# Patient Record
Sex: Female | Born: 1950
Health system: Southern US, Community
[De-identification: ages and names within clinical notes are randomized; demographics above are authoritative.]

## PROBLEM LIST (undated history)

## (undated) DIAGNOSIS — E049 Nontoxic goiter, unspecified: Secondary | ICD-10-CM

## (undated) DIAGNOSIS — I10 Essential (primary) hypertension: Secondary | ICD-10-CM

## (undated) DIAGNOSIS — I4891 Unspecified atrial fibrillation: Secondary | ICD-10-CM

## (undated) DIAGNOSIS — E079 Disorder of thyroid, unspecified: Secondary | ICD-10-CM

## (undated) DIAGNOSIS — I499 Cardiac arrhythmia, unspecified: Secondary | ICD-10-CM

## (undated) HISTORY — PX: ABDOMINAL HYSTERECTOMY: SHX81

## (undated) HISTORY — DX: Essential (primary) hypertension: I10

## (undated) HISTORY — DX: Cardiac arrhythmia, unspecified: I49.9

---

## 2003-05-18 ENCOUNTER — Emergency Department (HOSPITAL_COMMUNITY): Admission: EM | Admit: 2003-05-18 | Discharge: 2003-05-18 | Payer: Self-pay | Admitting: Emergency Medicine

## 2003-05-18 ENCOUNTER — Encounter: Payer: Self-pay | Admitting: *Deleted

## 2003-06-01 ENCOUNTER — Encounter (HOSPITAL_COMMUNITY): Admission: RE | Admit: 2003-06-01 | Discharge: 2003-08-30 | Payer: Self-pay | Admitting: Family Medicine

## 2017-06-11 ENCOUNTER — Emergency Department (HOSPITAL_BASED_OUTPATIENT_CLINIC_OR_DEPARTMENT_OTHER)
Admission: EM | Admit: 2017-06-11 | Discharge: 2017-06-11 | Disposition: A | Discharge status: Home / Self Care | Payer: Commercial Managed Care - PPO | Attending: Emergency Medicine | Admitting: Emergency Medicine

## 2017-06-11 ENCOUNTER — Encounter (HOSPITAL_BASED_OUTPATIENT_CLINIC_OR_DEPARTMENT_OTHER): Payer: Self-pay | Admitting: *Deleted

## 2017-06-11 ENCOUNTER — Emergency Department (HOSPITAL_BASED_OUTPATIENT_CLINIC_OR_DEPARTMENT_OTHER): Payer: Commercial Managed Care - PPO

## 2017-06-11 DIAGNOSIS — I480 Paroxysmal atrial fibrillation: Secondary | ICD-10-CM | POA: Diagnosis not present

## 2017-06-11 DIAGNOSIS — Z7901 Long term (current) use of anticoagulants: Secondary | ICD-10-CM | POA: Insufficient documentation

## 2017-06-11 DIAGNOSIS — R0602 Shortness of breath: Secondary | ICD-10-CM | POA: Diagnosis present

## 2017-06-11 DIAGNOSIS — I4891 Unspecified atrial fibrillation: Secondary | ICD-10-CM

## 2017-06-11 HISTORY — DX: Nontoxic goiter, unspecified: E04.9

## 2017-06-11 HISTORY — DX: Unspecified atrial fibrillation: I48.91

## 2017-06-11 HISTORY — DX: Disorder of thyroid, unspecified: E07.9

## 2017-06-11 LAB — BASIC METABOLIC PANEL
ANION GAP: 10 (ref 5–15)
BUN: 27 mg/dL — ABNORMAL HIGH (ref 6–20)
CHLORIDE: 103 mmol/L (ref 101–111)
CO2: 24 mmol/L (ref 22–32)
Calcium: 9.2 mg/dL (ref 8.9–10.3)
Creatinine, Ser: 1.49 mg/dL — ABNORMAL HIGH (ref 0.44–1.00)
GFR calc non Af Amer: 35 mL/min — ABNORMAL LOW (ref >60)
GFR, EST AFRICAN AMERICAN: 41 mL/min — AB (ref >60)
Glucose, Bld: 126 mg/dL — ABNORMAL HIGH (ref 65–99)
POTASSIUM: 3.6 mmol/L (ref 3.5–5.1)
SODIUM: 137 mmol/L (ref 135–145)

## 2017-06-11 LAB — CBC
HCT: 40.4 % (ref 36.0–46.0)
HEMOGLOBIN: 13.6 g/dL (ref 12.0–15.0)
MCH: 29.4 pg (ref 26.0–34.0)
MCHC: 33.7 g/dL (ref 30.0–36.0)
MCV: 87.3 fL (ref 78.0–100.0)
PLATELETS: 246 10*3/uL (ref 150–400)
RBC: 4.63 MIL/uL (ref 3.87–5.11)
RDW: 13.6 % (ref 11.5–15.5)
WBC: 8.6 10*3/uL (ref 4.0–10.5)

## 2017-06-11 LAB — TSH: TSH: 0.48 u[IU]/mL (ref 0.350–4.500)

## 2017-06-11 LAB — TROPONIN I: Troponin I: <0.03 ng/mL (ref <0.03)

## 2017-06-11 LAB — MAGNESIUM: Magnesium: 2.1 mg/dL (ref 1.7–2.4)

## 2017-06-11 MED ORDER — DEXTROSE 5 % IV SOLN
5.0000 mg/h | INTRAVENOUS | Status: DC
  Administered 2017-06-11: 5 mg/h via INTRAVENOUS
  Filled 2017-06-11: qty 100

## 2017-06-11 MED ORDER — DILTIAZEM LOAD VIA INFUSION
20.0000 mg | Freq: Once | INTRAVENOUS | Status: AC
  Administered 2017-06-11: 20 mg via INTRAVENOUS
  Filled 2017-06-11: qty 20

## 2017-06-11 MED ORDER — DILTIAZEM HCL ER COATED BEADS 180 MG PO CP24: 180.0000 mg | ORAL_CAPSULE | Freq: Every day | ORAL | 0 refills | Status: AC

## 2017-06-11 MED FILL — CARTIA XT 180 MG CAPSULE SA: 180 | 30 days supply | Qty: 30 | Fill #0

## 2017-06-11 NOTE — ED Provider Notes (Signed)
Pt sen and evaluated. Discussed with S. Joy PA-C. Patient presents in rapid A. fib has chest tightness for us of breath similar to past episodes. All symptoms resolved with rate control with patient given IV bolus Cardizem and placed on drip. PA Joy has discussed the patient with her cardiologist's on-call partner. We'll stop drip. At additional by mouth calcium channel blocker as needed. She is anticoagulated. She is symptom-free. Will follow up with physician in office in 2 weeks. We will increase her daily Cardizem from 120 CD, to 180 CD.  CRITICAL CARE Performed by: Rolland PorterJAMES, Amany Rando JOSEPH   Total critical care time: 30 minutes  Critical care time was exclusive of separately billable procedures and treating other patients.  Critical care was necessary to treat or prevent imminent or life-threatening deterioration.  Critical care was time spent personally by me on the following activities: development of treatment plan with patient and/or surrogate as well as nursing, discussions with consultants, evaluation of patient's response to treatment, examination of patient, obtaining history from patient or surrogate, ordering and performing treatments and interventions, ordering and review of laboratory studies, ordering and review of radiographic studies, pulse oximetry and re-evaluation of patient's condition.    Rolland PorterJames, Garlan Drewes, MD 06/11/17 [phone]

## 2017-06-11 NOTE — ED Provider Notes (Signed)
[facility] Provider Note   CSN: [id number] Arrival date & time: 06/11/17  1253     History   Chief Complaint Chief Complaint  Patient presents with  . Chest Pain    HPI Felicia Turner is a 66 y.o. female.  HPI   Felicia Turner is a 66 y.o. female, with a history of Atrial fibrillation, presenting to the ED with Chest discomfort and shortness of breath. Around 4 AM this morning while walking on her property she began to feel a fluttering in her chest with shortness of breath. States, "I just didn't feel right."   Patient was then at work around [employer] and began to feel a chest pressure and shortness of breath. Continues to feel chest pressure/tightness, rates it 2/10, nonradiating. Shortness of breath resolves at rest. Has felt this sensation before, but it was before she was placed on Eliquis last Fall. Admits to eating [place] food last night and then leftovers today.  Cardiologist: Clovis Riley with [facility] and then  Lavenia Atlas, DO, [address] [place], [place] Patient was last seen at the cardiologist on 05/02/2017 and was told to continue her Eliquis and Cardizem CD 120mg . States she took these medications this morning.  Status EKG reading from the cardiologist office is read as sinus bradycardia with a rate of 50.   Denies N/V/D, shortness of breath at rest, fever/chills, recent illness, peripheral edema, or any other complaints.  Past Medical History:  Diagnosis Date  . Atrial fibrillation (HCC)   . Goiter   . Thyroid disease     There are no active problems to display for this patient.   Past Surgical History:  Procedure Laterality Date  . ABDOMINAL HYSTERECTOMY      OB History    No data available       Home Medications    Prior to Admission medications   Medication Sig Start Date End Date Taking? Authorizing Provider  Apixaban (ELIQUIS PO) Take by mouth.   Yes [provider]  diltiazem (CARDIZEM CD) 180 MG 24 hr capsule Take  1 capsule (180 mg total) by mouth daily. 06/11/17   Creig Landin, Hillard Danker, PA-C    Family History No family history on file.  Social History Social History  Substance Use Topics  . Smoking status: Never Smoker  . Smokeless tobacco: Never Used  . Alcohol use No     Allergies   Patient has no known allergies.   Review of Systems Review of Systems  Constitutional: Positive for fatigue. Negative for chills, diaphoresis and fever.  Respiratory: Positive for shortness of breath (resolved at rest).   Cardiovascular: Positive for chest pain.  Gastrointestinal: Negative for nausea and vomiting.  All other systems reviewed and are negative.    Physical Exam Updated Vital Signs BP (!) 160/115   Pulse (!) 132   Temp 98.2 F (36.8 C) (Oral)   Resp (!) 22   Ht 5\' 11"  (1.803 m)   Wt 86.2 kg (190 lb)   SpO2 96%   BMI 26.50 kg/m   Physical Exam  Constitutional: She appears well-developed and well-nourished. No distress.  HENT:  Head: Normocephalic and atraumatic.  Eyes: Conjunctivae are normal.  Neck: Neck supple.  Cardiovascular: Normal heart sounds and intact distal pulses.  An irregularly irregular rhythm present. Tachycardia present.   Pulmonary/Chest: Effort normal and breath sounds normal. No respiratory distress.  No increased work of breathing. Patient speaks in full sentences without difficulty.  Abdominal: Soft. There is no  tenderness. There is no guarding.  Musculoskeletal: She exhibits no edema.  Lymphadenopathy:    She has no cervical adenopathy.  Neurological: She is alert.  Skin: Skin is warm and dry. She is not diaphoretic.  Psychiatric: She has a normal mood and affect. Her behavior is normal.  Nursing note and vitals reviewed.    ED Treatments / Results  Labs (all labs ordered are listed, but only abnormal results are displayed) Labs Reviewed  BASIC METABOLIC PANEL - Abnormal; Notable for the following:       Result Value   Glucose, Bld 126 (*)    BUN 27  (*)    Creatinine, Ser 1.49 (*)    GFR calc non Af Amer 35 (*)    GFR calc Af Amer 41 (*)    All other components within normal limits  CBC  TROPONIN I  MAGNESIUM  TROPONIN I  TSH    EKG  EKG Interpretation None     First EKG Afib RVR vs Aflutter vs MAT Second EKG Afib at 80  Radiology Dg Chest 2 View  Result Date: 06/11/2017 CLINICAL DATA:  Chest pain since this morning, history of atrial fibrillation EXAM: CHEST  2 VIEW COMPARISON:  None FINDINGS: Enlargement of cardiac silhouette. Atherosclerotic calcification aorta. Pulmonary vascularity normal. Large RIGHT paratracheal mass with displacement of the trachea RIGHT to LEFT and significant transverse compression of the trachea ; this could be due to a RIGHT thyroid mass or superior mediastinal mass/adenopathy. Lungs clear. No pleural effusion or pneumothorax. Bones demineralized with minimal scattered endplate spur formation thoracic spine. IMPRESSION: Enlargement of cardiac silhouette. Aortic atherosclerosis. No acute infiltrate. RIGHT paratracheal mass with significant mass effect and compression of the trachea, could be due to RIGHT thyroid mass, adenopathy or a superior mediastinal mass. 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 Consider follow-up thyroid ultrasound to assess. Electronically Signed   By: Ulyses SouthwardMark  Boles M.D.   On: 06/11/2017 13:58    Procedures Procedures (including critical care time)  CRITICAL CARE Performed by: Jajaira Ruis C Clovia Reine Total critical care time: 30 minutes Critical care time was exclusive of separately billable procedures and treating other patients. Critical care was necessary to treat or prevent imminent or life-threatening deterioration. Critical care was time spent  personally by me on the following activities: development of treatment plan with patient and/or surrogate as well as nursing, discussions with consultants, evaluation of patient's response to treatment, examination of patient, obtaining history from patient or surrogate, ordering and performing treatments and interventions, ordering and review of laboratory studies, ordering and review of radiographic studies, pulse oximetry and re-evaluation of patient's condition.  Medications Ordered in ED Medications  diltiazem (CARDIZEM) 1 mg/mL load via infusion 20 mg (20 mg Intravenous Bolus from Bag 06/11/17 1354)     Initial Impression / Assessment and Plan / ED Course  I have reviewed the triage vital signs and the nursing notes.  Pertinent labs & imaging results that were available during my care of the patient were reviewed by me and considered in my medical decision making (see chart for details).  Clinical Course as of Jun 11 1750  Mon Jun 11, 2017  1404 Patient is now rate-controlled Afib at around 80. Pain and symptom-free.  [SJ]  1414  Spoke with Alesia Banda, receptionist at Dr. Larita Fife Kim's office. States she left a stat message for the on call cardiologist, Dr. Mayford Knife, and he will call me back.   [SJ]  1510 Had not yet heard from the on call cardiologist from Dr. Elmyra Ricks office. Called back to the office. Spoke with [employer]. Gave me Dr. Norris Cross pager number to call directly.  [SJ]  1518 Spoke with Okey Regal, RN for Dr. Elmyra Ricks office. States she is returning a call for a notification placed on their triage board which caused a delay in me getting a call back. States she spoke with Dr. Mayford Knife and Dr. Mayford Knife will not speak with me because he is the physician that is in the office today, but he is not the physician on call. States I need to call the hospital cardiologist on call who answers questions for patients in the practice.  [SJ]  1534 Spoke with Dr. Minna Merritts, on call cardiologist for [facility].  Recommends: Stop stop the Cardizem drip. Observe patient for at least 30 minutes. Assure patient's heart rate and blood pressure remain stable. Afib is ok as long as rate is controlled. If tachycardia returns, give 60 mg short-acting Cardizem by mouth. Observe for another 30 minutes. Discharge with prescription for 180 mg Cardizem CD daily beginning tomorrow morning. Patient to follow-up in the office in 2 weeks. Dr. Ivan Croft will message the office to set up this appointment. Patient should call in to confirm.  [SJ]  1605 Discussed updated plan of care with patient, including cardiologist recommendations. Agrees to the plan. Continues to be chest pain-free and symptom-free.  [SJ]  1730 Reexamined the patient prior to discharge. Continues to be symptom-free. Latest heart rate is 104 recorded from the monitor, but when taken manually I count it as 92.  [SJ]    Clinical Course User Index [SJ] Neilani Duffee C, PA-C    Patient presents with symptomatic atrial fibrillation with RVR. Suspect patient's dietary choices may have contributed to her presentation today. Patient's symptoms resolved with rate control. Patient remained symptom-free and rate controlled during observation period following Cardizem drip cessation. The patient was given instructions for home care as well as strict return precautions. Patient voices understanding of these instructions, accepts the plan, and is comfortable with discharge.    Findings and plan of care discussed with Rolland Porter, MD. Dr. Fayrene Fearing personally evaluated and examined this patient.  Vitals:   06/11/17 1629 06/11/17 1645 06/11/17 1700 06/11/17 1715  BP: (!) 146/99 (!) 142/107  (!) 161/89  Pulse: 85 98 87 (!) 104  Resp: 16 17 15 20   Temp:      TempSrc:      SpO2: 96% 95% 99% 98%  Weight:      Height:          Final Clinical Impressions(s) / ED Diagnoses   Final diagnoses:  Atrial fibrillation with RVR [facility])    New Prescriptions Discharge Medication  List as of 06/11/2017  5:24 PM       Anselm Pancoast, PA-C 06/11/17 1751    Rolland Porter, MD 06/13/17 1109

## 2017-06-11 NOTE — Discharge Instructions (Signed)
You have been seen today for rapid atrial fibrillation. This was corrected here in the ED. Stop taking the 120 mg Cardizem  Begin taking the 180 mg Cardizem tomorrow.  Be sure to stay well-hydrated.  Avoid foods with high sodium content and MSG as this may be one of your triggers.  [facility] in about 2 weeks. Call their office to make sure you have an appointment set up. Return to the ED should you have any recurrence of symptoms.

## 2017-06-11 NOTE — ED Triage Notes (Signed)
She was at work and felt her heart fluttering. She felt SOB and was told to come here.

## 2017-06-11 NOTE — ED Notes (Signed)
Pt directed to pharmacy to pick up new Rx

## 2018-07-08 IMAGING — CR DG CHEST 2V
2 series · 2 of 2 positions shown · non-contrast
Comparison: None

CLINICAL DATA: Chest pain since this morning, history of atrial
fibrillation

EXAM:
CHEST  2 VIEW

[w chest pa]
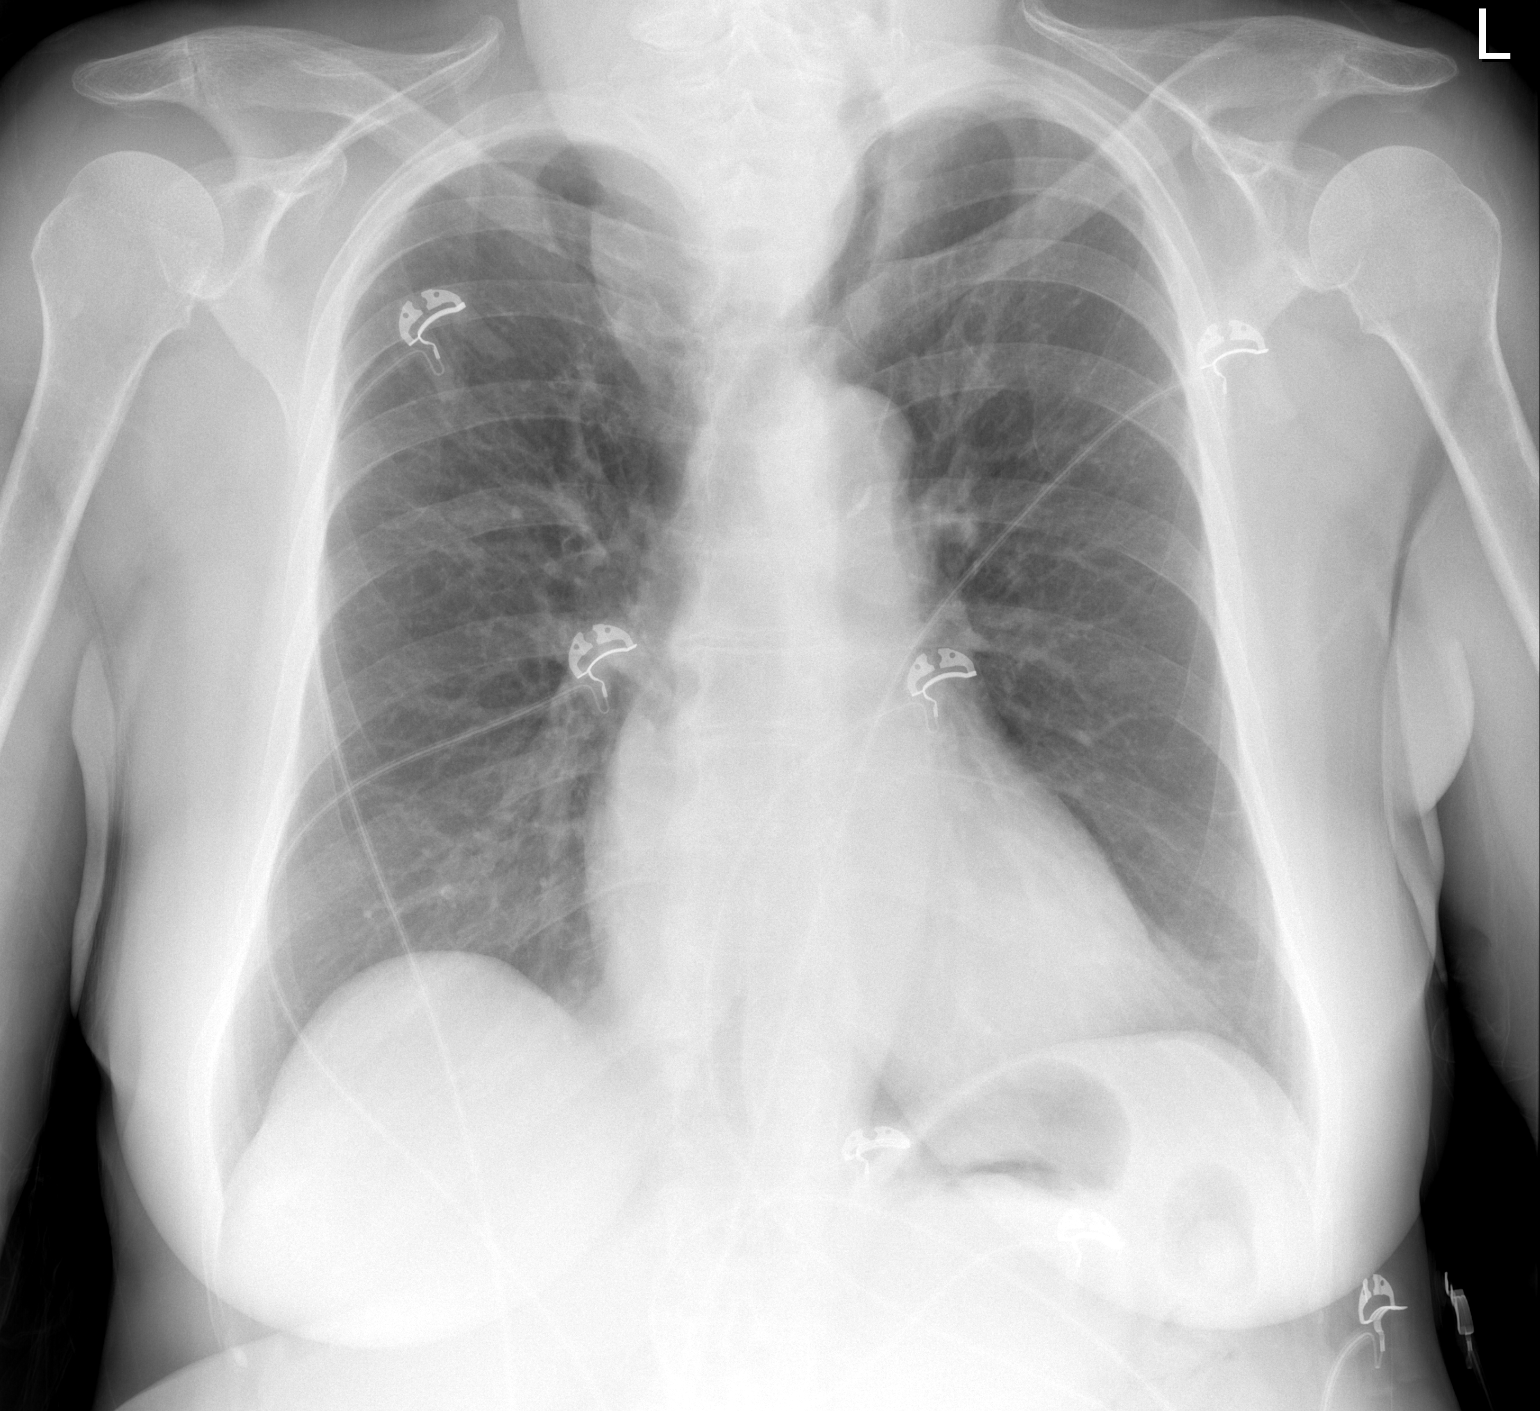

[w chest lat]
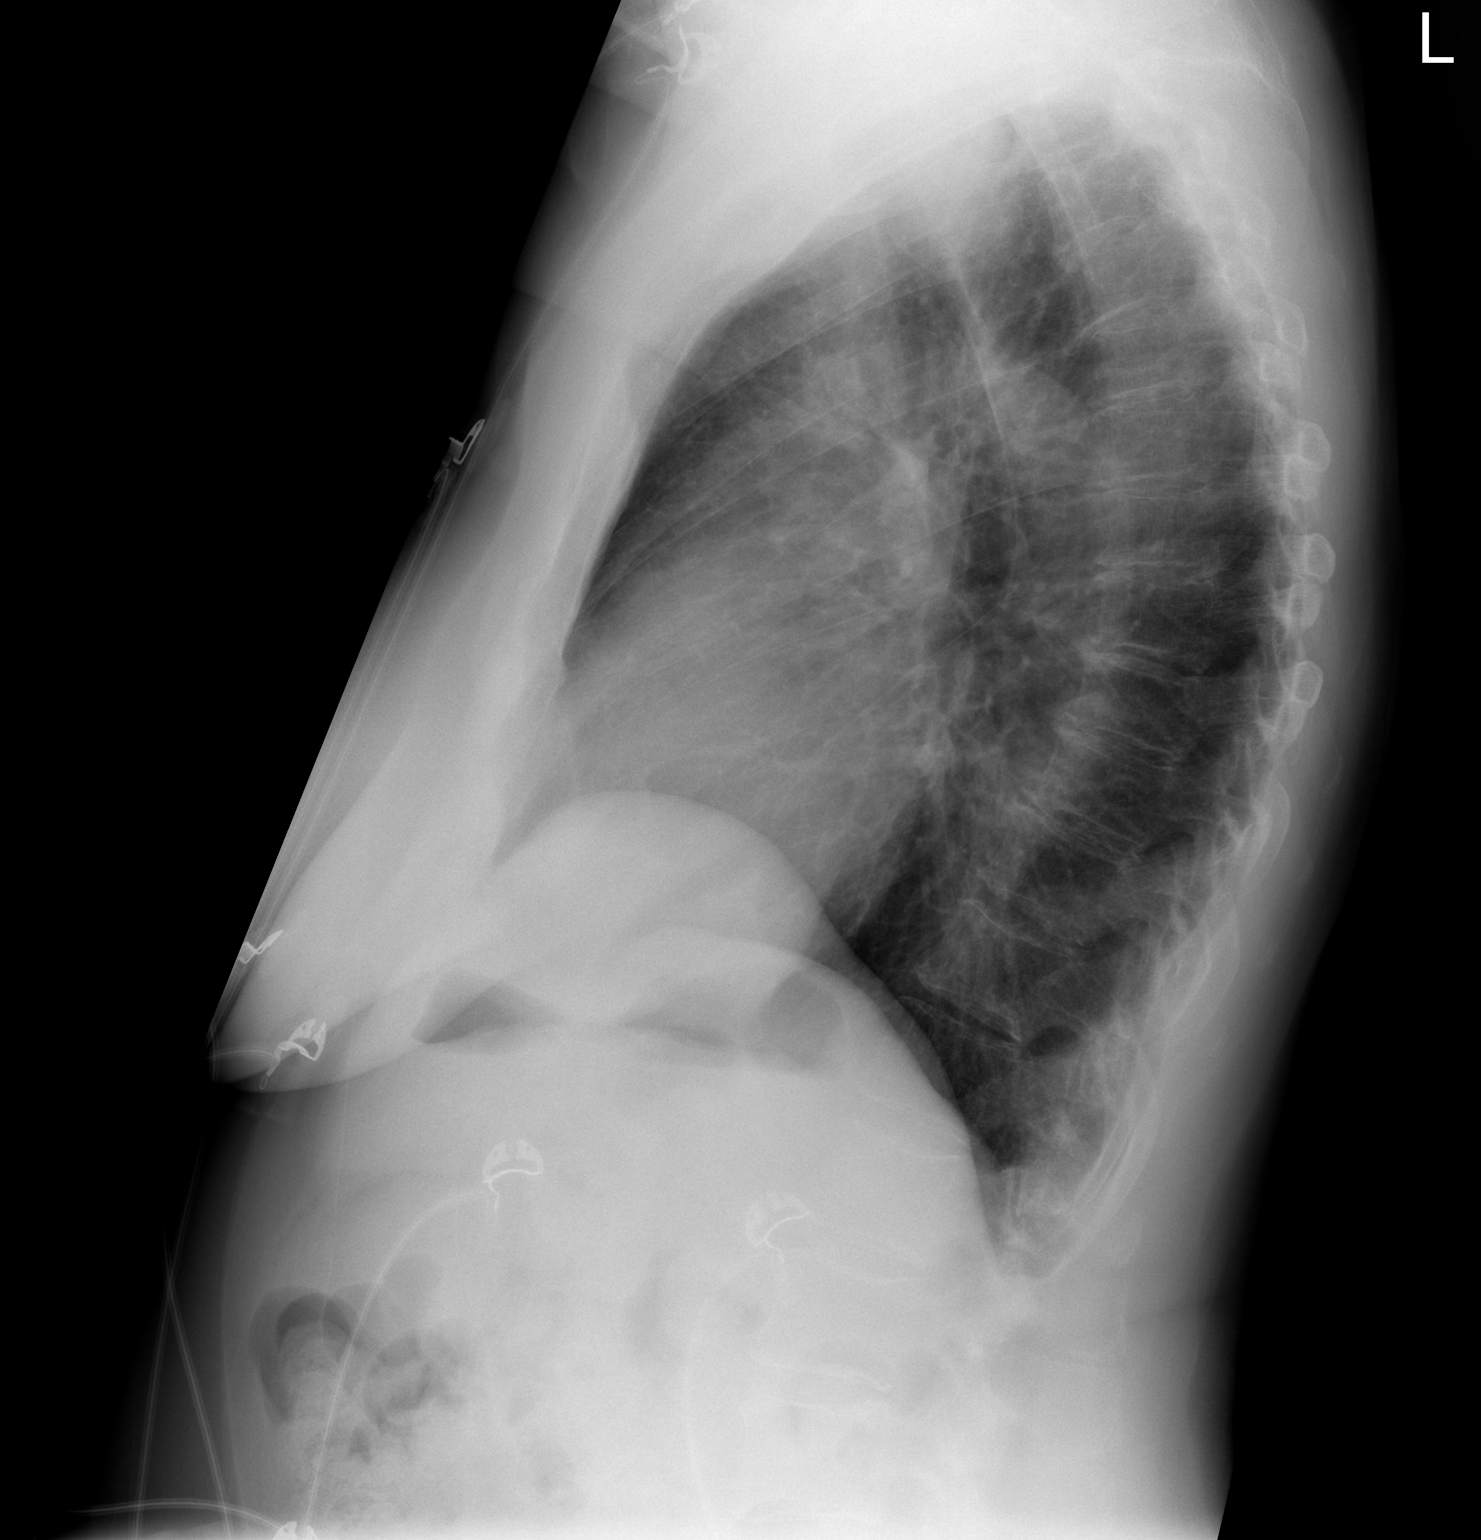

[2 of 2 positions shown; findings below may reference images not displayed]

FINDINGS: Enlargement of cardiac silhouette.

Atherosclerotic calcification aorta.

Pulmonary vascularity normal.

Large RIGHT paratracheal mass with displacement of the trachea RIGHT
to LEFT and significant transverse compression of the trachea ; this
could be due to a RIGHT thyroid mass or superior mediastinal
mass/adenopathy.

Lungs clear.

No pleural effusion or pneumothorax.

Bones demineralized with minimal scattered endplate spur formation
thoracic spine.
IMPRESSION: Enlargement of cardiac silhouette.

Aortic atherosclerosis.

No acute infiltrate.

RIGHT paratracheal mass with significant mass effect and compression
of the trachea, could be due to RIGHT thyroid mass, adenopathy or a
superior mediastinal mass.

Reports of prior chest radiograph and CT chest exams from 5668,
images of which are not available, indicate presence of tracheal
deviation RIGHT to LEFT by a 5.3 x 4.3 cm diameter RIGHT lobe
thyroid mass, though unable to determine stability in the absence of
the previous images.

Consider follow-up thyroid ultrasound to assess.

## 2020-10-18 DIAGNOSIS — Z0000 Encounter for general adult medical examination without abnormal findings: Secondary | ICD-10-CM | POA: Diagnosis not present

## 2020-10-18 DIAGNOSIS — Z13220 Encounter for screening for lipoid disorders: Secondary | ICD-10-CM | POA: Diagnosis not present

## 2020-10-18 DIAGNOSIS — E049 Nontoxic goiter, unspecified: Secondary | ICD-10-CM | POA: Diagnosis not present

## 2020-10-18 DIAGNOSIS — I10 Essential (primary) hypertension: Secondary | ICD-10-CM | POA: Diagnosis not present

## 2020-10-18 DIAGNOSIS — Z1159 Encounter for screening for other viral diseases: Secondary | ICD-10-CM | POA: Diagnosis not present

## 2020-10-18 DIAGNOSIS — Z131 Encounter for screening for diabetes mellitus: Secondary | ICD-10-CM | POA: Diagnosis not present

## 2020-11-08 DIAGNOSIS — Z9889 Other specified postprocedural states: Secondary | ICD-10-CM | POA: Diagnosis not present

## 2020-11-08 DIAGNOSIS — E7800 Pure hypercholesterolemia, unspecified: Secondary | ICD-10-CM | POA: Diagnosis not present

## 2020-11-08 DIAGNOSIS — I10 Essential (primary) hypertension: Secondary | ICD-10-CM | POA: Diagnosis not present

## 2020-12-10 DIAGNOSIS — I10 Essential (primary) hypertension: Secondary | ICD-10-CM | POA: Diagnosis not present

## 2020-12-10 DIAGNOSIS — E7800 Pure hypercholesterolemia, unspecified: Secondary | ICD-10-CM | POA: Diagnosis not present

## 2021-01-12 ENCOUNTER — Encounter: Payer: Self-pay | Admitting: General Practice

## 2021-03-16 NOTE — Progress Notes (Unsigned)
Cardiology Office Note:   Date:  03/17/2021  NAME:  Felicia Turner    MRN: [id number] DOB:  12-10-1951   PCP:  Kathlynn Grate, FNP  Cardiologist:  No primary care provider on file.   Referring MD: Aliene Beams, MD   Chief Complaint  Patient presents with  . Atrial Fibrillation   History of Present Illness:   Felicia Turner is a 70 y.o. female with a hx of pAF, HTN, HLD who is being seen today for the evaluation of atrial fibrillation at the request of Kathlynn Grate, FNP.  She is done well since her A. fib ablation.  No recurrence.  EKG shows she is in sinus rhythm.  She does report she can get short of breath with activity.  Has improved but she is still active.  She reports that she has retired.  [place]  She does a lot of walking.  Symptoms are slowly improving.  She did have mild mitral regurgitation on echocardiogram in 2018.  No murmur on exam.  She does need repeat.  No evidence of volume overload.  She denies any chest pain or pressure.  No bleeding on Eliquis.  Blood pressure 130/68 which is well controlled on current medications.  Her most recent lipid profile shows her LDL cholesterol is 87.  She is a never smoker.  She does not drink alcohol or use drugs.  There is no strong family history of heart disease.  Surgical history significant for cardiac ablation.  Her symptoms of shortness of breath appear to occur daily.  Mainly occur with heavy exertion.  No chest pain or chest pressure reported.  Problem List 1. Paroxysmal Afib -s/p ablation 09/2016 -CHADSVASC = 3 (age, sex, HTN) 2. HLD -Total cholesterol 166, HDL 62, LDL 87, triglycerides 89 3. HTN  Past Medical History: Past Medical History:  Diagnosis Date  . Arrhythmia   . Atrial fibrillation (HCC)   . Goiter   . Hypertension   . Thyroid disease     Past Surgical History: Past Surgical History:  Procedure Laterality Date  . ABDOMINAL HYSTERECTOMY    . afib ablation  2018    Current  Medications: Current Meds  Medication Sig  . amLODipine (NORVASC) 10 MG tablet Take 10 mg by mouth daily.  Marland Kitchen Apixaban (ELIQUIS PO) Take 5 mg by mouth 2 (two) times daily.  Marland Kitchen Apoaequorin (PREVAGEN) 10 MG CAPS Take 1 capsule by mouth as directed.  Marland Kitchen atorvastatin (LIPITOR) 40 MG tablet Take 40 mg by mouth daily.  Marland Kitchen diltiazem (CARDIZEM CD) 180 MG 24 hr capsule Take 1 capsule (180 mg total) by mouth daily.  . fenoprofen (NALFON) 600 MG TABS tablet Take 600 mg by mouth daily.  Marland Kitchen levocetirizine (XYZAL) 5 MG tablet Take 5 mg by mouth every evening.  . Multiple Vitamins-Minerals (PRESERVISION AREDS 2 PO) Take 2 tablets by mouth daily.  Marland Kitchen olmesartan (BENICAR) 40 MG tablet Take 40 mg by mouth daily.  . Omega-3 Fatty Acids (OMEGA-3 PLUS PO) Take 1 tablet by mouth as directed.     Allergies:    Patient has no known allergies.   Social History: Social History   Socioeconomic History  . Marital status: Single    Spouse name: Not on file  . Number of children: 0  . Years of education: Not on file  . Highest education level: Not on file  Occupational History  . Not on file  Tobacco Use  . Smoking status: Never Smoker  .  Smokeless tobacco: Never Used  Substance and Sexual Activity  . Alcohol use: No  . Drug use: No  . Sexual activity: Not on file  Other Topics Concern  . Not on file  Social History Narrative  . Not on file   Social Determinants of Health   Financial Resource Strain: Not on file  Food Insecurity: Not on file  Transportation Needs: Not on file  Physical Activity: Not on file  Stress: Not on file  Social Connections: Not on file     Family History: The patient's family history includes Diabetes in her mother; Hypertension in her mother.  ROS:   All other ROS reviewed and negative. Pertinent positives noted in the HPI.     EKGs/Labs/Other Studies Reviewed:   The following studies were personally reviewed by me today:  EKG:  EKG is ordered today.  The ekg ordered  today demonstrates sinus bradycardia heart rate 51, no acute ischemic changes or evidence of infarction, and was personally reviewed by me.   Echocardiogram ([facility] 10/02/2018) EF 60-65%, mild MR, mildly dilated left atrium  48-hour Holter 03/15/2017 -A. fib detected  Recent Labs: No results found for requested labs within last 8760 hours.   Recent Lipid Panel No results found for: CHOL, TRIG, HDL, CHOLHDL, VLDL, LDLCALC, LDLDIRECT  Physical Exam:   VS:  BP 130/68 (BP Location: Right Arm, Patient Position: Sitting, Cuff Size: Normal)   Pulse (!) 51   Ht 5\' 11"  (1.803 m)   Wt 188 lb 9.6 oz (85.5 kg)   SpO2 97%   BMI 26.30 kg/m    Wt Readings from Last 3 Encounters:  03/17/21 188 lb 9.6 oz (85.5 kg)  06/11/17 190 lb (86.2 kg)    General: Well nourished, well developed, in no acute distress Head: Atraumatic, normal size  Eyes: PEERLA, EOMI  Neck: Supple, no JVD, goiter noted Endocrine: No thryomegaly Cardiac: Normal S1, S2; RRR; no murmurs, rubs, or gallops Lungs: Clear to auscultation bilaterally, no wheezing, rhonchi or rales  Abd: Soft, nontender, no hepatomegaly  Ext: No edema, pulses 2+ Musculoskeletal: No deformities, BUE and BLE strength normal and equal Skin: Warm and dry, no rashes   Neuro: Alert and oriented to person, place, time, and situation, CNII-XII grossly intact, no focal deficits  Psych: Normal mood and affect   ASSESSMENT:   Felicia Turner is a 70 y.o. female who presents for the following: 1. Paroxysmal atrial fibrillation (HCC)   2. SOB (shortness of breath) on exertion   3. Adequate anticoagulation on anticoagulant therapy   4. Mixed hyperlipidemia   5. Primary hypertension     PLAN:   1. Paroxysmal atrial fibrillation (HCC) -Status post A. fib ablation in 2017.  No recurrence of atrial fibrillation. CHADSVASC=3.  She will continue Eliquis 5 mg twice daily. -EKG shows sinus rhythm.  She will continue to monitor for recurrence.  Hopefully  this will not happen.  2. SOB (shortness of breath) on exertion -She does report shortness of breath on exertion.  No angina or chest pressure reported.  Suspect this is deconditioning.  I would like to check a BNP and repeat an echocardiogram.  There is mention of mild mitral regurgitation on her echocardiogram from [facility] 3 years ago.  We will make sure there is no change. -I have encouraged regular activity in the interim.  Lungs are clear. -She does have a thyroid goiter.  I do not think this plays into this.  However it is rather large.  I  have recommended she discuss this with her primary care doctor.  This could be causing compressive symptoms.  3. Adequate anticoagulation on anticoagulant therapy -Continue Eliquis.  4. Mixed hyperlipidemia -Most recent LDL 87.  On a statin.  Likely acceptable for her age.  5. Primary hypertension -Well-controlled on current medications.   Disposition: Return in about 1 year (around 03/17/2022).  Medication Adjustments/Labs and Tests Ordered: Current medicines are reviewed at length with the patient today.  Concerns regarding medicines are outlined above.  Orders Placed This Encounter  Procedures  . Brain natriuretic peptide  . EKG 12-Lead  . ECHOCARDIOGRAM COMPLETE   No orders of the defined types were placed in this encounter.   Patient Instructions  Medication Instructions:  The current medical regimen is effective;  continue present plan and medications.  *If you need a refill on your cardiac medications before your next appointment, please call your pharmacy*   Lab Work: BNP today   If you have labs (blood work) drawn today and your tests are completely normal, you will receive your results only by: Marland Kitchen MyChart Message (if you have MyChart) OR . A paper copy in the mail If you have any lab test that is abnormal or we need to change your treatment, we will call you to review the results.   Testing/Procedures: Echocardiogram -  Your physician has requested that you have an echocardiogram. Echocardiography is a painless test that uses sound waves to create images of your heart. It provides your doctor with information about the size and shape of your heart and how well your heart's chambers and valves are working. This procedure takes approximately one hour. There are no restrictions for this procedure. This will be performed at our [facility] location - [address], [address].    Follow-Up: At [facility], you and your health needs are our priority.  As part of our continuing mission to provide you with exceptional heart care, we have created designated Provider Care Teams.  These Care Teams include your primary Cardiologist (physician) and Advanced Practice Providers (APPs -  Physician Assistants and Nurse Practitioners) who all work together to provide you with the care you need, when you need it.  We recommend signing up for the patient portal called "MyChart".  Sign up information is provided on this After Visit Summary.  MyChart is used to connect with patients for Virtual Visits (Telemedicine).  Patients are able to view lab/test results, encounter notes, upcoming appointments, etc.  Non-urgent messages can be sent to your provider as well.   To learn more about what you can do with MyChart, go to [email or web address].    Your next appointment:   12 month(s)  The format for your next appointment:   In Person  Provider:   Lennie Odor, MD        Signed, Lenna Gilford. Flora Lipps, MD, [facility]  [facility]  [address] [place], [place] [address] [phone]  03/17/2021 9:05 AM

## 2021-03-17 ENCOUNTER — Encounter: Payer: Self-pay | Admitting: Cardiovascular Disease

## 2021-03-17 ENCOUNTER — Other Ambulatory Visit: Payer: Self-pay

## 2021-03-17 ENCOUNTER — Ambulatory Visit: Payer: Medicare Other | Admitting: Cardiovascular Disease

## 2021-03-17 VITALS — BP 130/68 | HR 51 | Ht 71.0 in | Wt 188.6 lb

## 2021-03-17 DIAGNOSIS — E782 Mixed hyperlipidemia: Secondary | ICD-10-CM | POA: Diagnosis not present

## 2021-03-17 DIAGNOSIS — R0602 Shortness of breath: Secondary | ICD-10-CM

## 2021-03-17 DIAGNOSIS — Z7901 Long term (current) use of anticoagulants: Secondary | ICD-10-CM | POA: Diagnosis not present

## 2021-03-17 DIAGNOSIS — I480 Paroxysmal atrial fibrillation: Secondary | ICD-10-CM

## 2021-03-17 DIAGNOSIS — I10 Essential (primary) hypertension: Secondary | ICD-10-CM

## 2021-03-17 NOTE — Patient Instructions (Signed)
Medication Instructions:  The current medical regimen is effective;  continue present plan and medications.  *If you need a refill on your cardiac medications before your next appointment, please call your pharmacy*   Lab Work: BNP today   If you have labs (blood work) drawn today and your tests are completely normal, you will receive your results only by: Marland Kitchen MyChart Message (if you have MyChart) OR . A paper copy in the mail If you have any lab test that is abnormal or we need to change your treatment, we will call you to review the results.   Testing/Procedures: Echocardiogram - Your physician has requested that you have an echocardiogram. Echocardiography is a painless test that uses sound waves to create images of your heart. It provides your doctor with information about the size and shape of your heart and how well your heart's chambers and valves are working. This procedure takes approximately one hour. There are no restrictions for this procedure. This will be performed at our [facility] location - [address].    Follow-Up: At [facility] you and your health needs are our priority.  As part of our continuing mission to provide you with exceptional heart care, we have created designated Provider Care Teams.  These Care Teams include your primary Cardiologist (physician) and Advanced Practice Providers (APPs -  Physician Assistants and Nurse Practitioners) who all work together to provide you with the care you need, when you need it.  We recommend signing up for the patient portal called "MyChart".  Sign up information is provided on this After Visit Summary.  MyChart is used to connect with patients for Virtual Visits (Telemedicine).  Patients are able to view lab/test results, encounter notes, upcoming appointments, etc.  Non-urgent messages can be sent to your provider as well.   To learn more about what you can do with MyChart, go to [email or web address].     Your next appointment:   12 month(s)  The format for your next appointment:   In Person  Provider:   Lennie Odor, MD

## 2021-03-18 LAB — BRAIN NATRIURETIC PEPTIDE: BNP: 56.7 pg/mL (ref 0.0–100.0)

## 2021-04-13 ENCOUNTER — Other Ambulatory Visit (HOSPITAL_COMMUNITY): Payer: Medicare Other

## 2021-05-17 ENCOUNTER — Ambulatory Visit (HOSPITAL_COMMUNITY): Payer: Medicare HMO | Attending: Internal Medicine

## 2021-05-17 ENCOUNTER — Other Ambulatory Visit: Payer: Self-pay

## 2021-05-17 DIAGNOSIS — R0602 Shortness of breath: Secondary | ICD-10-CM | POA: Insufficient documentation

## 2021-05-17 LAB — ECHOCARDIOGRAM COMPLETE
Area-P 1/2: 3.6 cm2
S' Lateral: 2.9 cm

## 2021-07-18 DIAGNOSIS — Z809 Family history of malignant neoplasm, unspecified: Secondary | ICD-10-CM | POA: Diagnosis not present

## 2021-07-18 DIAGNOSIS — I10 Essential (primary) hypertension: Secondary | ICD-10-CM | POA: Diagnosis not present

## 2021-07-18 DIAGNOSIS — Z7901 Long term (current) use of anticoagulants: Secondary | ICD-10-CM | POA: Diagnosis not present

## 2021-07-18 DIAGNOSIS — E785 Hyperlipidemia, unspecified: Secondary | ICD-10-CM | POA: Diagnosis not present

## 2021-07-18 DIAGNOSIS — D6869 Other thrombophilia: Secondary | ICD-10-CM | POA: Diagnosis not present

## 2021-07-18 DIAGNOSIS — Z7722 Contact with and (suspected) exposure to environmental tobacco smoke (acute) (chronic): Secondary | ICD-10-CM | POA: Diagnosis not present

## 2021-07-18 DIAGNOSIS — I4891 Unspecified atrial fibrillation: Secondary | ICD-10-CM | POA: Diagnosis not present

## 2021-10-20 DIAGNOSIS — I480 Paroxysmal atrial fibrillation: Secondary | ICD-10-CM | POA: Diagnosis not present

## 2021-10-20 DIAGNOSIS — E7800 Pure hypercholesterolemia, unspecified: Secondary | ICD-10-CM | POA: Diagnosis not present

## 2021-10-20 DIAGNOSIS — D6869 Other thrombophilia: Secondary | ICD-10-CM | POA: Diagnosis not present

## 2021-10-20 DIAGNOSIS — Z79899 Other long term (current) drug therapy: Secondary | ICD-10-CM | POA: Diagnosis not present

## 2021-10-20 DIAGNOSIS — Z0000 Encounter for general adult medical examination without abnormal findings: Secondary | ICD-10-CM | POA: Diagnosis not present

## 2021-10-20 DIAGNOSIS — I10 Essential (primary) hypertension: Secondary | ICD-10-CM | POA: Diagnosis not present

## 2021-10-20 DIAGNOSIS — E049 Nontoxic goiter, unspecified: Secondary | ICD-10-CM | POA: Diagnosis not present

## 2022-04-13 DIAGNOSIS — Z809 Family history of malignant neoplasm, unspecified: Secondary | ICD-10-CM | POA: Diagnosis not present

## 2022-04-13 DIAGNOSIS — I10 Essential (primary) hypertension: Secondary | ICD-10-CM | POA: Diagnosis not present

## 2022-04-13 DIAGNOSIS — Z8249 Family history of ischemic heart disease and other diseases of the circulatory system: Secondary | ICD-10-CM | POA: Diagnosis not present

## 2022-04-13 DIAGNOSIS — I4891 Unspecified atrial fibrillation: Secondary | ICD-10-CM | POA: Diagnosis not present

## 2022-04-13 DIAGNOSIS — Z7901 Long term (current) use of anticoagulants: Secondary | ICD-10-CM | POA: Diagnosis not present

## 2022-04-13 DIAGNOSIS — Z008 Encounter for other general examination: Secondary | ICD-10-CM | POA: Diagnosis not present

## 2022-04-13 DIAGNOSIS — J309 Allergic rhinitis, unspecified: Secondary | ICD-10-CM | POA: Diagnosis not present

## 2022-04-13 DIAGNOSIS — G8929 Other chronic pain: Secondary | ICD-10-CM | POA: Diagnosis not present

## 2022-04-13 DIAGNOSIS — Z833 Family history of diabetes mellitus: Secondary | ICD-10-CM | POA: Diagnosis not present

## 2022-04-13 DIAGNOSIS — E785 Hyperlipidemia, unspecified: Secondary | ICD-10-CM | POA: Diagnosis not present

## 2022-04-13 DIAGNOSIS — D6869 Other thrombophilia: Secondary | ICD-10-CM | POA: Diagnosis not present

## 2022-04-25 DIAGNOSIS — I480 Paroxysmal atrial fibrillation: Secondary | ICD-10-CM | POA: Diagnosis not present

## 2022-04-25 DIAGNOSIS — D6869 Other thrombophilia: Secondary | ICD-10-CM | POA: Diagnosis not present

## 2022-04-25 DIAGNOSIS — Z1211 Encounter for screening for malignant neoplasm of colon: Secondary | ICD-10-CM | POA: Diagnosis not present

## 2022-04-25 DIAGNOSIS — E7800 Pure hypercholesterolemia, unspecified: Secondary | ICD-10-CM | POA: Diagnosis not present

## 2022-04-25 DIAGNOSIS — I10 Essential (primary) hypertension: Secondary | ICD-10-CM | POA: Diagnosis not present

## 2022-07-06 DIAGNOSIS — H43811 Vitreous degeneration, right eye: Secondary | ICD-10-CM | POA: Diagnosis not present

## 2022-07-06 DIAGNOSIS — H04123 Dry eye syndrome of bilateral lacrimal glands: Secondary | ICD-10-CM | POA: Diagnosis not present

## 2022-07-06 DIAGNOSIS — H2513 Age-related nuclear cataract, bilateral: Secondary | ICD-10-CM | POA: Diagnosis not present

## 2022-07-06 DIAGNOSIS — H1045 Other chronic allergic conjunctivitis: Secondary | ICD-10-CM | POA: Diagnosis not present

## 2022-10-26 DIAGNOSIS — E049 Nontoxic goiter, unspecified: Secondary | ICD-10-CM | POA: Diagnosis not present

## 2022-10-26 DIAGNOSIS — E7800 Pure hypercholesterolemia, unspecified: Secondary | ICD-10-CM | POA: Diagnosis not present

## 2022-10-26 DIAGNOSIS — I10 Essential (primary) hypertension: Secondary | ICD-10-CM | POA: Diagnosis not present

## 2022-10-26 DIAGNOSIS — Z0000 Encounter for general adult medical examination without abnormal findings: Secondary | ICD-10-CM | POA: Diagnosis not present

## 2022-10-26 DIAGNOSIS — I480 Paroxysmal atrial fibrillation: Secondary | ICD-10-CM | POA: Diagnosis not present

## 2022-10-26 DIAGNOSIS — D6869 Other thrombophilia: Secondary | ICD-10-CM | POA: Diagnosis not present

## 2022-10-30 DIAGNOSIS — Z1211 Encounter for screening for malignant neoplasm of colon: Secondary | ICD-10-CM | POA: Diagnosis not present

## 2023-04-24 DIAGNOSIS — E785 Hyperlipidemia, unspecified: Secondary | ICD-10-CM | POA: Diagnosis not present

## 2023-04-24 DIAGNOSIS — Z008 Encounter for other general examination: Secondary | ICD-10-CM | POA: Diagnosis not present

## 2023-04-24 DIAGNOSIS — I10 Essential (primary) hypertension: Secondary | ICD-10-CM | POA: Diagnosis not present

## 2023-04-24 DIAGNOSIS — Z7901 Long term (current) use of anticoagulants: Secondary | ICD-10-CM | POA: Diagnosis not present

## 2023-04-24 DIAGNOSIS — E012 Iodine-deficiency related (endemic) goiter, unspecified: Secondary | ICD-10-CM | POA: Diagnosis not present

## 2023-04-24 DIAGNOSIS — Z833 Family history of diabetes mellitus: Secondary | ICD-10-CM | POA: Diagnosis not present

## 2023-04-24 DIAGNOSIS — J309 Allergic rhinitis, unspecified: Secondary | ICD-10-CM | POA: Diagnosis not present

## 2023-04-24 DIAGNOSIS — I4891 Unspecified atrial fibrillation: Secondary | ICD-10-CM | POA: Diagnosis not present

## 2023-04-24 DIAGNOSIS — Z8249 Family history of ischemic heart disease and other diseases of the circulatory system: Secondary | ICD-10-CM | POA: Diagnosis not present

## 2023-04-24 DIAGNOSIS — Z809 Family history of malignant neoplasm, unspecified: Secondary | ICD-10-CM | POA: Diagnosis not present

## 2023-04-24 DIAGNOSIS — D6869 Other thrombophilia: Secondary | ICD-10-CM | POA: Diagnosis not present

## 2023-07-11 DIAGNOSIS — R195 Other fecal abnormalities: Secondary | ICD-10-CM | POA: Diagnosis not present

## 2023-07-11 DIAGNOSIS — E049 Nontoxic goiter, unspecified: Secondary | ICD-10-CM | POA: Diagnosis not present

## 2023-07-11 DIAGNOSIS — D6869 Other thrombophilia: Secondary | ICD-10-CM | POA: Diagnosis not present

## 2023-07-11 DIAGNOSIS — I480 Paroxysmal atrial fibrillation: Secondary | ICD-10-CM | POA: Diagnosis not present

## 2023-07-11 DIAGNOSIS — E7800 Pure hypercholesterolemia, unspecified: Secondary | ICD-10-CM | POA: Diagnosis not present

## 2023-07-11 DIAGNOSIS — I10 Essential (primary) hypertension: Secondary | ICD-10-CM | POA: Diagnosis not present

## 2023-07-11 DIAGNOSIS — R0609 Other forms of dyspnea: Secondary | ICD-10-CM | POA: Diagnosis not present

## 2023-11-29 DIAGNOSIS — Z79899 Other long term (current) drug therapy: Secondary | ICD-10-CM | POA: Diagnosis not present

## 2023-11-29 DIAGNOSIS — E049 Nontoxic goiter, unspecified: Secondary | ICD-10-CM | POA: Diagnosis not present

## 2023-11-29 DIAGNOSIS — E7800 Pure hypercholesterolemia, unspecified: Secondary | ICD-10-CM | POA: Diagnosis not present

## 2023-11-29 DIAGNOSIS — D6869 Other thrombophilia: Secondary | ICD-10-CM | POA: Diagnosis not present

## 2023-11-29 DIAGNOSIS — Z0000 Encounter for general adult medical examination without abnormal findings: Secondary | ICD-10-CM | POA: Diagnosis not present

## 2023-11-29 DIAGNOSIS — I10 Essential (primary) hypertension: Secondary | ICD-10-CM | POA: Diagnosis not present

## 2023-11-29 DIAGNOSIS — I480 Paroxysmal atrial fibrillation: Secondary | ICD-10-CM | POA: Diagnosis not present

## 2024-05-30 DIAGNOSIS — E049 Nontoxic goiter, unspecified: Secondary | ICD-10-CM | POA: Diagnosis not present

## 2024-05-30 DIAGNOSIS — R809 Proteinuria, unspecified: Secondary | ICD-10-CM | POA: Diagnosis not present

## 2024-05-30 DIAGNOSIS — R944 Abnormal results of kidney function studies: Secondary | ICD-10-CM | POA: Diagnosis not present

## 2024-06-23 DIAGNOSIS — I10 Essential (primary) hypertension: Secondary | ICD-10-CM | POA: Diagnosis not present

## 2024-06-23 DIAGNOSIS — E7800 Pure hypercholesterolemia, unspecified: Secondary | ICD-10-CM | POA: Diagnosis not present

## 2024-06-23 DIAGNOSIS — I480 Paroxysmal atrial fibrillation: Secondary | ICD-10-CM | POA: Diagnosis not present

## 2024-07-24 DIAGNOSIS — I480 Paroxysmal atrial fibrillation: Secondary | ICD-10-CM | POA: Diagnosis not present

## 2024-07-24 DIAGNOSIS — E7800 Pure hypercholesterolemia, unspecified: Secondary | ICD-10-CM | POA: Diagnosis not present

## 2024-07-24 DIAGNOSIS — I10 Essential (primary) hypertension: Secondary | ICD-10-CM | POA: Diagnosis not present

## 2024-08-24 DIAGNOSIS — I480 Paroxysmal atrial fibrillation: Secondary | ICD-10-CM | POA: Diagnosis not present

## 2024-08-24 DIAGNOSIS — I10 Essential (primary) hypertension: Secondary | ICD-10-CM | POA: Diagnosis not present

## 2024-08-24 DIAGNOSIS — E7800 Pure hypercholesterolemia, unspecified: Secondary | ICD-10-CM | POA: Diagnosis not present

## 2024-09-23 DIAGNOSIS — I480 Paroxysmal atrial fibrillation: Secondary | ICD-10-CM | POA: Diagnosis not present

## 2024-09-23 DIAGNOSIS — E7800 Pure hypercholesterolemia, unspecified: Secondary | ICD-10-CM | POA: Diagnosis not present

## 2024-09-23 DIAGNOSIS — I10 Essential (primary) hypertension: Secondary | ICD-10-CM | POA: Diagnosis not present

## 2024-09-24 DIAGNOSIS — R0602 Shortness of breath: Secondary | ICD-10-CM | POA: Diagnosis not present

## 2024-10-24 DIAGNOSIS — E7800 Pure hypercholesterolemia, unspecified: Secondary | ICD-10-CM | POA: Diagnosis not present

## 2024-10-24 DIAGNOSIS — I480 Paroxysmal atrial fibrillation: Secondary | ICD-10-CM | POA: Diagnosis not present

## 2024-10-24 DIAGNOSIS — I10 Essential (primary) hypertension: Secondary | ICD-10-CM | POA: Diagnosis not present

## 2024-11-23 DIAGNOSIS — I480 Paroxysmal atrial fibrillation: Secondary | ICD-10-CM | POA: Diagnosis not present

## 2024-11-23 DIAGNOSIS — I10 Essential (primary) hypertension: Secondary | ICD-10-CM | POA: Diagnosis not present

## 2024-11-23 DIAGNOSIS — E7800 Pure hypercholesterolemia, unspecified: Secondary | ICD-10-CM | POA: Diagnosis not present
